# Patient Record
Sex: Male | Born: 1990 | Race: Black or African American | Hispanic: No | Marital: Single | Smoking: Former smoker
Health system: Southern US, Community
[De-identification: ages and names within clinical notes are randomized; demographics above are authoritative.]

---

## 2015-03-14 ENCOUNTER — Emergency Department (HOSPITAL_COMMUNITY): Payer: No Typology Code available for payment source

## 2015-03-14 ENCOUNTER — Emergency Department (HOSPITAL_COMMUNITY)
Admission: EM | Admit: 2015-03-14 | Discharge: 2015-03-14 | Disposition: A | Discharge status: Home / Self Care | Payer: No Typology Code available for payment source | Attending: Emergency Medicine | Admitting: Emergency Medicine

## 2015-03-14 ENCOUNTER — Encounter (HOSPITAL_COMMUNITY): Payer: Self-pay

## 2015-03-14 DIAGNOSIS — Y998 Other external cause status: Secondary | ICD-10-CM | POA: Insufficient documentation

## 2015-03-14 DIAGNOSIS — F17210 Nicotine dependence, cigarettes, uncomplicated: Secondary | ICD-10-CM | POA: Insufficient documentation

## 2015-03-14 DIAGNOSIS — Y9389 Activity, other specified: Secondary | ICD-10-CM | POA: Insufficient documentation

## 2015-03-14 DIAGNOSIS — S60512A Abrasion of left hand, initial encounter: Secondary | ICD-10-CM | POA: Insufficient documentation

## 2015-03-14 DIAGNOSIS — S20212A Contusion of left front wall of thorax, initial encounter: Secondary | ICD-10-CM | POA: Diagnosis not present

## 2015-03-14 DIAGNOSIS — Y92410 Unspecified street and highway as the place of occurrence of the external cause: Secondary | ICD-10-CM | POA: Diagnosis not present

## 2015-03-14 DIAGNOSIS — S3991XA Unspecified injury of abdomen, initial encounter: Secondary | ICD-10-CM | POA: Diagnosis not present

## 2015-03-14 DIAGNOSIS — S299XXA Unspecified injury of thorax, initial encounter: Secondary | ICD-10-CM | POA: Diagnosis present

## 2015-03-14 DIAGNOSIS — S8001XA Contusion of right knee, initial encounter: Secondary | ICD-10-CM | POA: Diagnosis not present

## 2015-03-14 MED ORDER — CYCLOBENZAPRINE HCL 10 MG PO TABS: 10.0000 mg | ORAL_TABLET | Freq: Two times a day (BID) | ORAL | Status: DC | PRN

## 2015-03-14 MED ORDER — IOHEXOL 300 MG/ML  SOLN
75.0000 mL | Freq: Once | INTRAMUSCULAR | Status: AC | PRN
  Administered 2015-03-14: 75 mL via INTRAVENOUS

## 2015-03-14 MED ORDER — NAPROXEN 500 MG PO TABS: 500.0000 mg | ORAL_TABLET | Freq: Two times a day (BID) | ORAL | Status: DC
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
## 2017-09-22 ENCOUNTER — Ambulatory Visit (INDEPENDENT_AMBULATORY_CARE_PROVIDER_SITE_OTHER): Payer: Medicaid Other

## 2017-09-22 ENCOUNTER — Ambulatory Visit (HOSPITAL_COMMUNITY)
Admission: EM | Admit: 2017-09-22 | Discharge: 2017-09-22 | Disposition: A | Discharge status: Home / Self Care | Payer: Medicaid Other | Attending: Family Medicine | Admitting: Family Medicine

## 2017-09-22 ENCOUNTER — Encounter (HOSPITAL_COMMUNITY): Payer: Self-pay | Admitting: Emergency Medicine

## 2017-09-22 ENCOUNTER — Other Ambulatory Visit: Payer: Self-pay

## 2017-09-22 DIAGNOSIS — S92351A Displaced fracture of fifth metatarsal bone, right foot, initial encounter for closed fracture: Secondary | ICD-10-CM

## 2017-09-22 MED ORDER — HYDROCODONE-ACETAMINOPHEN 5-325 MG PO TABS: 1.0000 | ORAL_TABLET | Freq: Four times a day (QID) | ORAL | 0 refills | Status: DC | PRN

## 2017-09-22 MED ORDER — MELOXICAM 7.5 MG PO TABS: 7.5000 mg | ORAL_TABLET | Freq: Every day | ORAL | 0 refills | Status: DC

## 2017-09-22 NOTE — ED Triage Notes (Addendum)
Was playing basketball 2 days ago.  States he landed on foot wrong and has right foot pain Can wiggle toes, pedal pulse 2 +, brisk cap refill

## 2017-09-22 NOTE — ED Provider Notes (Signed)
[facility]    CSN: [id number] Arrival date & time: 09/22/17  1545     History   Chief Complaint Chief Complaint  Patient presents with  . Foot Injury    HPI Albert Valenzuela is a 28 y.o. male.   27 year old male comes in for right foot pain after injury 2 days ago.  States he was playing basketball, jumped up and landed with his foot inverted.  He has not been able to bear weight since, has been hobbling on the left foot, as well as using a cane.  He has swelling to the foot with contusions to the toe.  States some intermittent numbness and tingling of the fourth and fifth toe.  Has tried ice compress, elevation, Ace wrap, ibuprofen with little relief.     History reviewed. No pertinent past medical history.  There are no active problems to display for this patient.   History reviewed. No pertinent surgical history.     Home Medications    Prior to Admission medications   Medication Sig Start Date End Date Taking? Authorizing Provider  cyclobenzaprine (FLEXERIL) 10 MG tablet Take 1 tablet (10 mg total) by mouth 2 (two) times daily as needed for muscle spasms. 03/14/15   Kirichenko, Lemont Fillers, PA-C  HYDROcodone-acetaminophen (NORCO/VICODIN) 5-325 MG tablet Take 1 tablet by mouth every 6 (six) hours as needed. 09/22/17   Cathie Hoops, Yasmina Chico V, PA-C  meloxicam (MOBIC) 7.5 MG tablet Take 1 tablet (7.5 mg total) by mouth daily. 09/22/17   Cathie Hoops, Earsel Shouse V, PA-C  naproxen (NAPROSYN) 500 MG tablet Take 1 tablet (500 mg total) by mouth 2 (two) times daily. 03/14/15   Jaynie Crumble, PA-C    Family History Family History  Problem Relation Age of Onset  . Healthy Mother     Social History Social History   Tobacco Use  . Smoking status: Current Every Day Smoker    Packs/day: 0.50    Types: Cigarettes  Substance Use Topics  . Alcohol use: Yes    Comment: socially   . Drug use: Yes    Types: Marijuana     Allergies   Patient has no known allergies.   Review of  Systems Review of Systems  Reason unable to perform ROS: See HPI as above.     Physical Exam Triage Vital Signs ED Triage Vitals  Enc Vitals Group     BP 09/22/17 1618 126/77     Pulse Rate 09/22/17 1618 89     Resp 09/22/17 1618 20     Temp 09/22/17 1618 98.7 F (37.1 C)     Temp Source 09/22/17 1618 Oral     SpO2 09/22/17 1618 96 %     Weight --      Height --      Head Circumference --      Peak Flow --      Pain Score 09/22/17 1615 7     Pain Loc --      Pain Edu? --      Excl. in GC? --    No data found.  Updated Vital Signs BP 126/77 (BP Location: Right Arm)   Pulse 89   Temp 98.7 F (37.1 C) (Oral)   Resp 20   SpO2 96%   Physical Exam  Constitutional: He is oriented to person, place, and time. He appears well-developed and well-nourished. No distress.  HENT:  Head: Normocephalic and atraumatic.  Eyes: Pupils are equal, round, and reactive to light. Conjunctivae are normal.  Musculoskeletal:  Generalized swelling to the right foot with contusions to the dorsal aspect of lateral foot, 2nd-5th toe.  Tenderness to palpation of distal 3rd-4th MTP.  No tenderness to palpation of ankle.  Full range of motion of ankle.  Strength deferred.  Sensation intact and equal bilaterally.  Pedal pulse 2+ and equal bilaterally.  Cap refill tested on toe pad less than 2 seconds.  Neurological: He is alert and oriented to person, place, and time.  Skin: He is not diaphoretic.     UC Treatments / Results  Labs (all labs ordered are listed, but only abnormal results are displayed) Labs Reviewed - No data to display  EKG None  Radiology Dg Foot Complete Right  Result Date: 09/22/2017 CLINICAL DATA:  Right foot pain after basketball injury 2 days ago. EXAM: RIGHT FOOT COMPLETE - 3+ VIEW COMPARISON:  None. FINDINGS: Moderately displaced fracture is seen involving the proximal base of the fifth metatarsal. No other bony abnormality is noted. Joint spaces are intact. IMPRESSION:  Moderately displaced proximal fifth metatarsal fracture. Electronically Signed   By: Lupita RaiderJames  Green Jr, M.D.   On: 09/22/2017 16:44    Procedures Procedures (including critical care time)  Medications Ordered in UC Medications - No data to display  Initial Impression / Assessment and Plan / UC Course  I have reviewed the triage vital signs and the nursing notes.  Pertinent labs & imaging results that were available during my care of the patient were reviewed by me and considered in my medical decision making (see chart for details).    Discussed x-ray results with patient.  Cam walker, crutches, nonweightbearing until evaluated by orthopedics.  Start Mobic as directed.  Norco for breakthrough pain.  Continue ice compress, elevation.  Follow-up with orthopedics for further evaluation and management needed.  Return precautions given.  Patient expresses understanding and agrees to plan.  Final Clinical Impressions(s) / UC Diagnoses   Final diagnoses:  Closed displaced fracture of fifth metatarsal bone of right foot, initial encounter    ED Prescriptions    Medication Sig Dispense Auth. Provider   meloxicam (MOBIC) 7.5 MG tablet Take 1 tablet (7.5 mg total) by mouth daily. 15 tablet Taleya Whitcher V, PA-C   HYDROcodone-acetaminophen (NORCO/VICODIN) 5-325 MG tablet Take 1 tablet by mouth every 6 (six) hours as needed. 10 tablet Belinda FisherYu, Dekker Verga V, PA-C     Controlled Substance Prescriptions [place] Controlled Substance Registry consulted? Yes, I have consulted the  Controlled Substances Registry for this patient, and feel the risk/benefit ratio today is favorable for proceeding with this prescription for a controlled substance.   Belinda FisherYu, Brighten Orndoff V, PA-C 09/22/17 1712

## 2017-09-22 NOTE — Discharge Instructions (Signed)
Cam walker and crutches. No weight bearing for now. Mobic as directed. Norco for breakthrough pain. Continue ice compress, elevation. Follow up for orthopedics for further evaluation and management needed.

## 2019-05-03 ENCOUNTER — Ambulatory Visit: Payer: Self-pay | Attending: Internal Medicine

## 2019-09-01 IMAGING — DX DG FOOT COMPLETE 3+V*R*
3 series · 3 of 3 positions shown · non-contrast
Comparison: None.

CLINICAL DATA: Right foot pain after basketball injury 2 days ago.

EXAM:
RIGHT FOOT COMPLETE - 3+ VIEW

[foot ap]
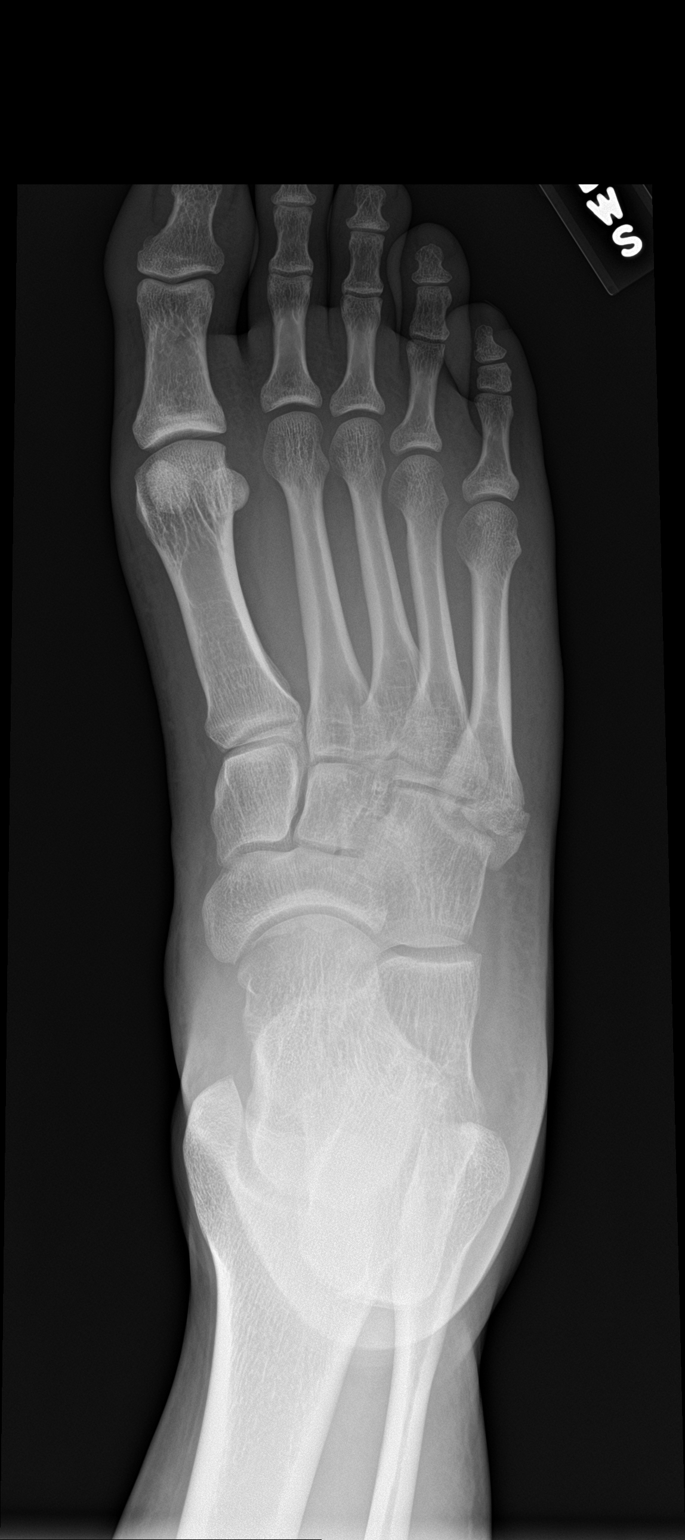

[foot obl]
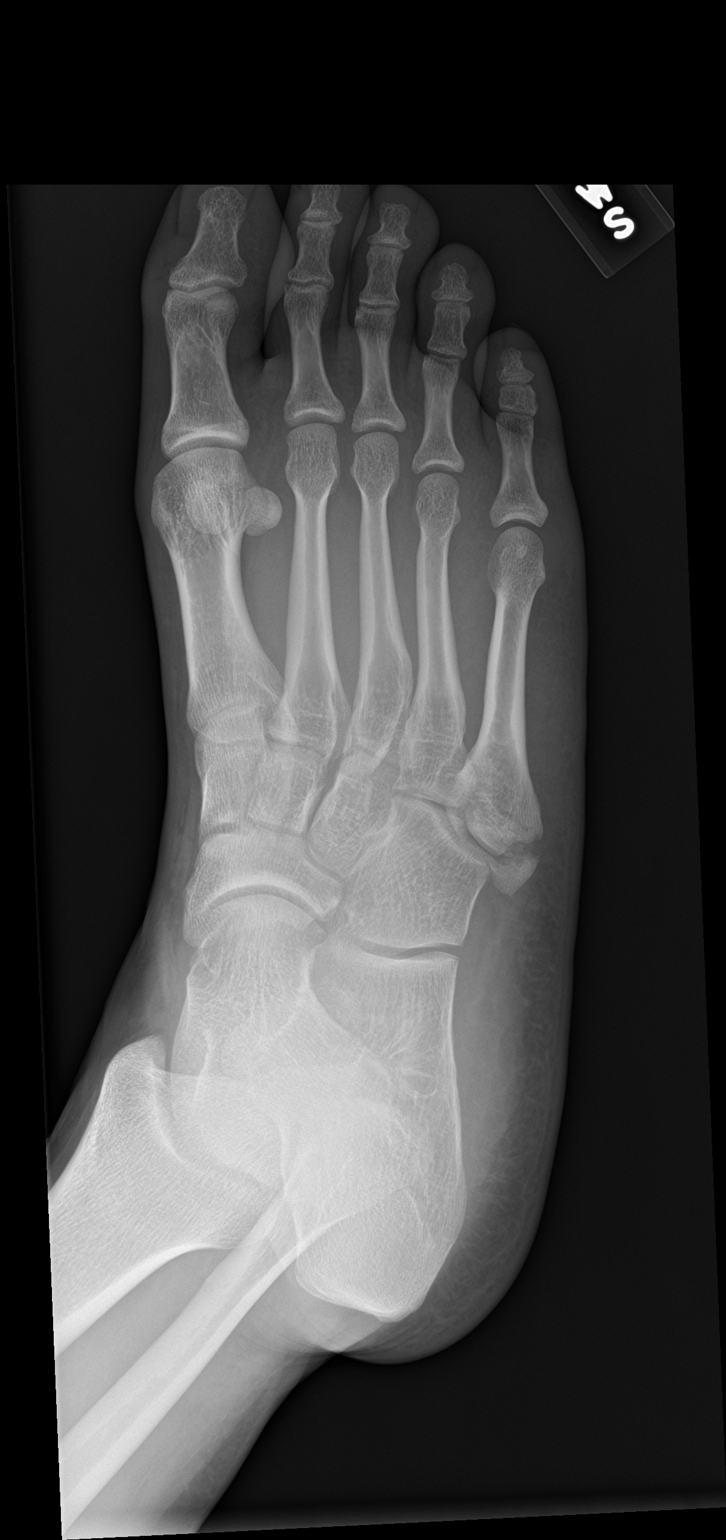

[foot lat]
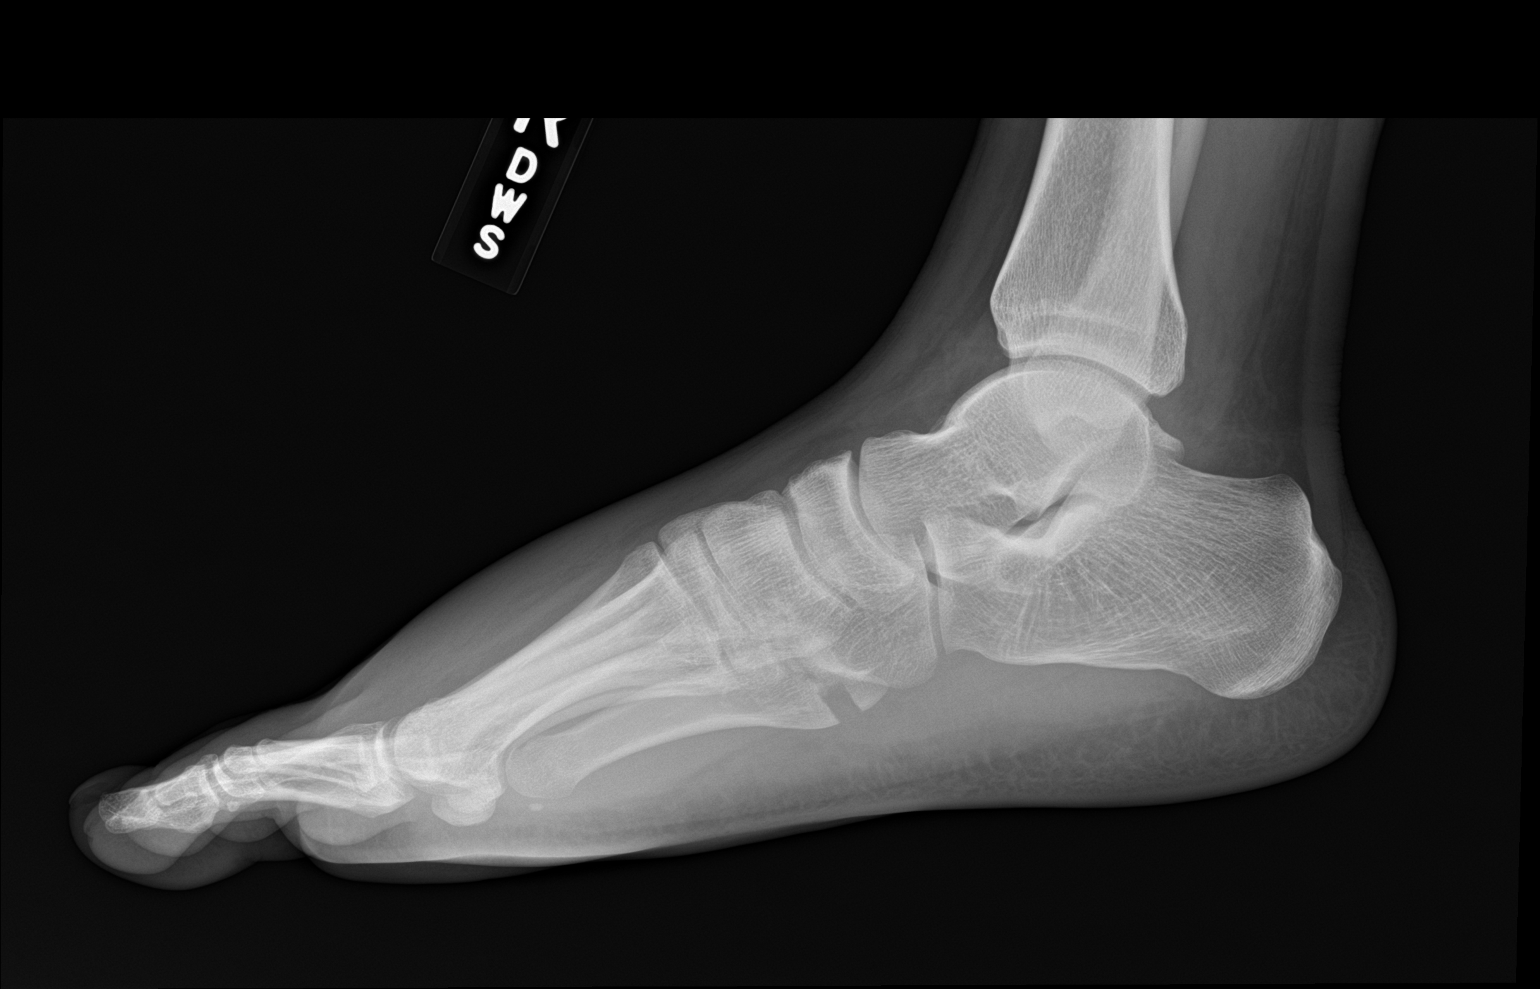

[3 of 3 positions shown; findings below may reference images not displayed]

FINDINGS: Moderately displaced fracture is seen involving the proximal base of
the fifth metatarsal. No other bony abnormality is noted. Joint
spaces are intact.
IMPRESSION: Moderately displaced proximal fifth metatarsal fracture.

## 2020-06-05 ENCOUNTER — Encounter (HOSPITAL_COMMUNITY): Payer: Self-pay | Admitting: Emergency Medicine

## 2021-12-03 ENCOUNTER — Encounter (HOSPITAL_COMMUNITY): Payer: Self-pay

## 2021-12-03 ENCOUNTER — Ambulatory Visit (HOSPITAL_COMMUNITY)
Admission: EM | Admit: 2021-12-03 | Discharge: 2021-12-03 | Disposition: A | Discharge status: Home / Self Care | Payer: Medicaid Other | Attending: Internal Medicine | Admitting: Internal Medicine

## 2021-12-03 DIAGNOSIS — M62830 Muscle spasm of back: Secondary | ICD-10-CM | POA: Insufficient documentation

## 2021-12-03 LAB — COMPREHENSIVE METABOLIC PANEL
ALT: 25 U/L (ref 0–44)
AST: 25 U/L (ref 15–41)
Albumin: 4.2 g/dL (ref 3.5–5.0)
Alkaline Phosphatase: 88 U/L (ref 38–126)
Anion gap: 5 (ref 5–15)
BUN: 19 mg/dL (ref 6–20)
CO2: 26 mmol/L (ref 22–32)
Calcium: 9.1 mg/dL (ref 8.9–10.3)
Chloride: 109 mmol/L (ref 98–111)
Creatinine, Ser: 1.33 mg/dL — ABNORMAL HIGH (ref 0.61–1.24)
GFR, Estimated: >60 mL/min (ref >60)
Glucose, Bld: 114 mg/dL — ABNORMAL HIGH (ref 70–99)
Potassium: 4.3 mmol/L (ref 3.5–5.1)
Sodium: 140 mmol/L (ref 135–145)
Total Bilirubin: 0.6 mg/dL (ref 0.3–1.2)
Total Protein: 6.3 g/dL — ABNORMAL LOW (ref 6.5–8.1)

## 2021-12-03 LAB — POCT URINALYSIS DIPSTICK, ED / UC
Bilirubin Urine: NEGATIVE
Glucose, UA: NEGATIVE mg/dL
Hgb urine dipstick: NEGATIVE
Leukocytes,Ua: NEGATIVE
Nitrite: NEGATIVE
Protein, ur: NEGATIVE mg/dL
Specific Gravity, Urine: 1.02 (ref 1.005–1.030)
Urobilinogen, UA: 1 mg/dL (ref 0.0–1.0)
pH: 6.5 (ref 5.0–8.0)

## 2021-12-03 MED ORDER — IBUPROFEN 800 MG PO TABS: 800.0000 mg | ORAL_TABLET | Freq: Three times a day (TID) | ORAL | 0 refills | Status: DC

## 2021-12-03 MED ORDER — CYCLOBENZAPRINE HCL 5 MG PO TABS: 10.0000 mg | ORAL_TABLET | Freq: Three times a day (TID) | ORAL | 0 refills | Status: DC | PRN

## 2021-12-03 MED ORDER — LORATADINE 10 MG PO TABS: 10.0000 mg | ORAL_TABLET | Freq: Every day | ORAL | 0 refills | Status: AC

## 2021-12-03 NOTE — ED Triage Notes (Signed)
Pt has pain on both left and right side ongoing for a long period of time as per pt. Pt denies fever and nausea. Pt descibe pain as a muscle spasm and gets hot at times in the area

## 2021-12-03 NOTE — Discharge Instructions (Signed)
Your symptoms are most likely muscle spasms to have a low suspicion of more serious organ involvement  However we will obtain lab work today to check your kidneys, you will be notified of any concerning values  You may use ibuprofen and muscle relaxers as needed for comfort, be mindful that muscle relaxers will make you drowsy  You may continue to stretch and massage the area as symptoms occur  You may place ice or heat over the affected area in 10 to 15-minute intervals  You may follow-up with urgent care as needed if symptoms persist or worsen, on your paperwork you have also been given written information to orthopedics for further evaluation and management

## 2021-12-03 NOTE — ED Provider Notes (Signed)
[facility]    CSN: [id number] Arrival date & time: 12/03/21  1456      History   Chief Complaint Chief Complaint  Patient presents with   Weakness    Pt has pain on right side and left side on and off ongoing     HPI Albert Valenzuela is a 31 y.o. male.   Patient presents with intermittent right and left lower back pain occurring for over 1 year.  Pain does not radiate and is described as a spasming.  Endorses site becomes hot almost like a flash and then resolves.  Stretching improves symptoms.  Symptoms occur without precipitating event.  Has attempted use of ibuprofen which has been helpful.  Denies numbness, tingling, injury or trauma.,  Abdominal pain, nausea, vomiting or diarrhea, urinary symptoms.  History reviewed. No pertinent past medical history.  There are no problems to display for this patient.   History reviewed. No pertinent surgical history.     Home Medications    Prior to Admission medications   Medication Sig Start Date End Date Taking? Authorizing Provider  cyclobenzaprine (FLEXERIL) 10 MG tablet Take 1 tablet (10 mg total) by mouth 2 (two) times daily as needed for muscle spasms. 03/14/15   Kirichenko, Lemont Fillers, PA-C  HYDROcodone-acetaminophen (NORCO/VICODIN) 5-325 MG tablet Take 1 tablet by mouth every 6 (six) hours as needed. 09/22/17   Cathie Hoops, Amy V, PA-C  meloxicam (MOBIC) 7.5 MG tablet Take 1 tablet (7.5 mg total) by mouth daily. 09/22/17   Cathie Hoops, Amy V, PA-C  naproxen (NAPROSYN) 500 MG tablet Take 1 tablet (500 mg total) by mouth 2 (two) times daily. 03/14/15   Jaynie Crumble, PA-C    Family History Family History  Problem Relation Age of Onset   Healthy Mother     Social History Social History   Tobacco Use   Smoking status: Every Day    Packs/day: 0.50    Types: Cigarettes  Substance Use Topics   Alcohol use: Yes    Comment: socially    Drug use: Yes    Types: Marijuana     Allergies   Patient has no known  allergies.   Review of Systems Review of Systems  Constitutional: Negative.   Respiratory: Negative.    Cardiovascular: Negative.   Gastrointestinal: Negative.   Genitourinary: Negative.   Musculoskeletal:  Positive for back pain. Negative for arthralgias, gait problem, joint swelling, myalgias, neck pain and neck stiffness.  Skin: Negative.   Neurological: Negative.      Physical Exam Triage Vital Signs ED Triage Vitals  Enc Vitals Group     BP 12/03/21 1522 114/77     Pulse Rate 12/03/21 1522 95     Resp 12/03/21 1522 12     Temp 12/03/21 1522 98.3 F (36.8 C)     Temp Source 12/03/21 1522 Oral     SpO2 12/03/21 1522 96 %     Weight 12/03/21 1525 215 lb (97.5 kg)     Height 12/03/21 1525 5\' 6"  (1.676 m)     Head Circumference --      Peak Flow --      Pain Score 12/03/21 1525 6     Pain Loc --      Pain Edu? --      Excl. in GC? --    No data found.  Updated Vital Signs BP 114/77 (BP Location: Left Arm)   Pulse 95   Temp 98.3 F (36.8 C) (Oral)   Resp 12  Ht 5\' 6"  (1.676 m)   Wt 215 lb (97.5 kg)   SpO2 96%   BMI 34.70 kg/m   Visual Acuity Right Eye Distance:   Left Eye Distance:   Bilateral Distance:    Right Eye Near:   Left Eye Near:    Bilateral Near:     Physical Exam Constitutional:      Appearance: Normal appearance.  Eyes:     Extraocular Movements: Extraocular movements intact.  Pulmonary:     Effort: Pulmonary effort is normal.  Abdominal:     Tenderness: There is no right CVA tenderness or left CVA tenderness.  Musculoskeletal:     Comments: Unable to reproduce tenderness on exam however patient begins to have a spasm while on exam table, no ecchymosis, swelling or deformity, negative straight leg test, able to bear weight to the lower extremities, range of motion intact  Neurological:     Mental Status: He is alert and oriented to person, place, and time. Mental status is at baseline.      UC Treatments / Results  Labs (all  labs ordered are listed, but only abnormal results are displayed) Labs Reviewed  POCT URINALYSIS DIPSTICK, ED / UC    EKG   Radiology No results found.  Procedures Procedures (including critical care time)  Medications Ordered in UC Medications - No data to display  Initial Impression / Assessment and Plan / UC Course  I have reviewed the triage vital signs and the nursing notes.  Pertinent labs & imaging results that were available during my care of the patient were reviewed by me and considered in my medical decision making (see chart for details).  Back muscle spasm  Etiology is most likely muscular based on timeline and presentation, urinalysis negative, CMP pending to rule out kidney involvement, negative for CVA tenderness, discussed with patient, prescribed Profen and Flexeril for outpatient management and recommended RICE, and stretching and massage for additional supportive measures, follow-up with urgent care as needed symptoms persist or worsen Final Clinical Impressions(s) / UC Diagnoses   Final diagnoses:  None   Discharge Instructions   None    ED Prescriptions   None    PDMP not reviewed this encounter.   , NP 12/03/21 1709

## 2022-07-28 ENCOUNTER — Other Ambulatory Visit: Payer: Self-pay

## 2022-07-28 ENCOUNTER — Emergency Department (HOSPITAL_COMMUNITY): Payer: Medicaid Other

## 2022-07-28 ENCOUNTER — Emergency Department (HOSPITAL_COMMUNITY)
Admission: EM | Admit: 2022-07-28 | Discharge: 2022-07-28 | Disposition: A | Discharge status: Home / Self Care | Payer: Medicaid Other | Attending: Emergency Medicine | Admitting: Emergency Medicine

## 2022-07-28 DIAGNOSIS — S1191XA Laceration without foreign body of unspecified part of neck, initial encounter: Secondary | ICD-10-CM | POA: Insufficient documentation

## 2022-07-28 DIAGNOSIS — W3409XA Accidental discharge from other specified firearms, initial encounter: Secondary | ICD-10-CM | POA: Insufficient documentation

## 2022-07-28 DIAGNOSIS — W3400XA Accidental discharge from unspecified firearms or gun, initial encounter: Secondary | ICD-10-CM

## 2022-07-28 DIAGNOSIS — S1190XA Unspecified open wound of unspecified part of neck, initial encounter: Secondary | ICD-10-CM | POA: Diagnosis present

## 2022-07-28 DIAGNOSIS — Z23 Encounter for immunization: Secondary | ICD-10-CM | POA: Diagnosis not present

## 2022-07-28 DIAGNOSIS — Y249XXA Unspecified firearm discharge, undetermined intent, initial encounter: Secondary | ICD-10-CM

## 2022-07-28 LAB — I-STAT CHEM 8, ED
BUN: 13 mg/dL (ref 6–20)
Calcium, Ion: 1.04 mmol/L — ABNORMAL LOW (ref 1.15–1.40)
Chloride: 105 mmol/L (ref 98–111)
Creatinine, Ser: 1.6 mg/dL — ABNORMAL HIGH (ref 0.61–1.24)
Glucose, Bld: 106 mg/dL — ABNORMAL HIGH (ref 70–99)
HCT: 48 % (ref 39.0–52.0)
Hemoglobin: 16.3 g/dL (ref 13.0–17.0)
Potassium: 3.5 mmol/L (ref 3.5–5.1)
Sodium: 143 mmol/L (ref 135–145)
TCO2: 23 mmol/L (ref 22–32)

## 2022-07-28 LAB — PROTIME-INR
INR: 0.9 (ref 0.8–1.2)
Prothrombin Time: 11.5 seconds (ref 11.4–15.2)

## 2022-07-28 LAB — CBC
HCT: 45.9 % (ref 39.0–52.0)
Hemoglobin: 15.9 g/dL (ref 13.0–17.0)
MCH: 35.4 pg — ABNORMAL HIGH (ref 26.0–34.0)
MCHC: 34.6 g/dL (ref 30.0–36.0)
MCV: 102.2 fL — ABNORMAL HIGH (ref 80.0–100.0)
Platelets: 301 10*3/uL (ref 150–400)
RBC: 4.49 MIL/uL (ref 4.22–5.81)
RDW: 12.7 % (ref 11.5–15.5)
WBC: 10.2 10*3/uL (ref 4.0–10.5)
nRBC: 0 % (ref 0.0–0.2)

## 2022-07-28 LAB — COMPREHENSIVE METABOLIC PANEL
ALT: 107 U/L — ABNORMAL HIGH (ref 0–44)
AST: 74 U/L — ABNORMAL HIGH (ref 15–41)
Albumin: 4.5 g/dL (ref 3.5–5.0)
Alkaline Phosphatase: 94 U/L (ref 38–126)
Anion gap: 18 — ABNORMAL HIGH (ref 5–15)
BUN: 12 mg/dL (ref 6–20)
CO2: 20 mmol/L — ABNORMAL LOW (ref 22–32)
Calcium: 9 mg/dL (ref 8.9–10.3)
Chloride: 104 mmol/L (ref 98–111)
Creatinine, Ser: 1.27 mg/dL — ABNORMAL HIGH (ref 0.61–1.24)
GFR, Estimated: >60 mL/min (ref >60)
Glucose, Bld: 109 mg/dL — ABNORMAL HIGH (ref 70–99)
Potassium: 3.5 mmol/L (ref 3.5–5.1)
Sodium: 142 mmol/L (ref 135–145)
Total Bilirubin: 0.5 mg/dL (ref 0.3–1.2)
Total Protein: 7.4 g/dL (ref 6.5–8.1)

## 2022-07-28 LAB — SAMPLE TO BLOOD BANK

## 2022-07-28 LAB — LACTIC ACID, PLASMA: Lactic Acid, Venous: 3.2 mmol/L (ref 0.5–1.9)

## 2022-07-28 LAB — ETHANOL: Alcohol, Ethyl (B): 302 mg/dL (ref <10)

## 2022-07-28 MED ORDER — IOHEXOL 350 MG/ML SOLN
75.0000 mL | Freq: Once | INTRAVENOUS | Status: AC | PRN
  Administered 2022-07-28: 75 mL via INTRAVENOUS

## 2022-07-28 MED ORDER — TETANUS-DIPHTH-ACELL PERTUSSIS 5-2.5-18.5 LF-MCG/0.5 IM SUSY
0.5000 mL | PREFILLED_SYRINGE | Freq: Once | INTRAMUSCULAR | Status: AC
  Administered 2022-07-28: 0.5 mL via INTRAMUSCULAR
  Filled 2022-07-28: qty 0.5

## 2022-07-28 MED ORDER — LACTATED RINGERS IV BOLUS: 1000.0000 mL | Freq: Once | INTRAVENOUS | Status: DC
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
## 2022-07-28 NOTE — Progress Notes (Signed)
Orthopedic Tech Progress Note Patient Details:  Albert Valenzuela Oct 05, 1990 [id number]  Patient ID: Albert Valenzuela, male   DOB: [birth year], 32 y.o.   MRN: [id number] I attended trauma page. Albert Valenzuela 07/28/2022, 4:50 AM
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
## 2022-07-28 NOTE — ED Notes (Signed)
Patient transported to CT 

## 2022-07-28 NOTE — ED Notes (Signed)
Pt IV out and d/c paper handed to patient. Patient refused IV. fluids

## 2022-07-29 ENCOUNTER — Ambulatory Visit
Admission: EM | Admit: 2022-07-29 | Discharge: 2022-07-29 | Disposition: A | Discharge status: Home / Self Care | Payer: Medicaid Other | Attending: Urgent Care | Admitting: Urgent Care

## 2022-07-29 DIAGNOSIS — S1193XA Puncture wound without foreign body of unspecified part of neck, initial encounter: Secondary | ICD-10-CM | POA: Diagnosis not present

## 2022-07-29 DIAGNOSIS — T148XXA Other injury of unspecified body region, initial encounter: Secondary | ICD-10-CM

## 2022-07-29 DIAGNOSIS — L089 Local infection of the skin and subcutaneous tissue, unspecified: Secondary | ICD-10-CM

## 2022-07-29 MED ORDER — TIZANIDINE HCL 4 MG PO TABS: 4.0000 mg | ORAL_TABLET | Freq: Every day | ORAL | 0 refills | Status: AC

## 2022-07-29 MED ORDER — NAPROXEN 500 MG PO TABS: 500.0000 mg | ORAL_TABLET | Freq: Two times a day (BID) | ORAL | 0 refills | Status: AC

## 2022-07-29 MED ORDER — NAPROXEN 500 MG PO TABS: 500.0000 mg | ORAL_TABLET | Freq: Two times a day (BID) | ORAL | 0 refills | Status: DC

## 2022-07-29 MED ORDER — CEPHALEXIN 500 MG PO CAPS: 500.0000 mg | ORAL_CAPSULE | Freq: Three times a day (TID) | ORAL | 0 refills | Status: AC
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
## 2022-07-29 NOTE — Discharge Instructions (Signed)
Please start cephalexin for the wound infection. Change your dressing 3-5 times daily. Every time you change your dressing, clean the wound gently with warm water and Dial antibacterial soap. Pat the wound dry, let it breathe for roughly an hour before covering it back up. When you reapply a dressing, apply Bacitracin ointment to the wound, then cover with non-stick/non-adherent gauze.
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
## 2022-08-05 ENCOUNTER — Encounter: Payer: Medicaid Other | Attending: Physician Assistant | Admitting: Physician Assistant

## 2022-08-05 DIAGNOSIS — S1193XA Puncture wound without foreign body of unspecified part of neck, initial encounter: Secondary | ICD-10-CM | POA: Insufficient documentation

## 2022-08-05 DIAGNOSIS — L98492 Non-pressure chronic ulcer of skin of other sites with fat layer exposed: Secondary | ICD-10-CM | POA: Insufficient documentation

## 2022-08-05 DIAGNOSIS — W3400XA Accidental discharge from unspecified firearms or gun, initial encounter: Secondary | ICD-10-CM | POA: Insufficient documentation

## 2022-08-05 NOTE — Progress Notes (Signed)
RAMIE, ABDALLA ([id number]) [phone] [id number] Page 1 of 4 Visit Report for 08/05/2022 Abuse Risk Screen Details Patient Name: Date of Service: Albert Valenzuela, Albert Valenzuela ND 08/05/2022 2:00 PM Medical Record Number: [id number] Patient Account Number: [id number] Date of Birth/Sex: Treating RN: Aug 14, 1990 (31 y.o. Roel Cluck Primary Care Kamillah Didonato: PA Zenovia Jordan, NO Other Clinician: Referring Tangela Dolliver: Treating Rasheida Broden/Extender: Christella Noa in Treatment: 0 Abuse Risk Screen Items Answer Electronic Signature(s) Signed: 08/05/2022 3:04:30 PM By: Midge Aver MSN RN CNS WTA Entered By: Midge Aver on 08/05/2022 13:52:58 -------------------------------------------------------------------------------- Activities of Daily Living Details Patient Name: Date of Service: Albert Valenzuela, Albert Valenzuela ND 08/05/2022 2:00 PM Medical Record Number: [id number] Patient Account Number: [id number] Date of Birth/Sex: Treating RN: Oct 23, 1990 (31 y.o. Roel Cluck Primary Care Yeni Jiggetts: PA Zenovia Jordan, NO Other Clinician: Referring Jamisha Hoeschen: Treating Madyn Ivins/Extender: Christella Noa in Treatment: 0 Activities of Daily Living Items Answer Activities of Daily Living (Please select one for each item) Drive Automobile Completely Able T Medications ake Completely Able Use T elephone Completely Able Care for Appearance Completely Able Use T oilet Completely Able Bath / Shower Completely Able Dress Self Completely Able Feed Self Completely Able Walk Completely Able Get In / Out Bed Completely Able Housework Completely Able Prepare Meals Completely Able Handle Money Completely Able Shop for Self Completely Able Electronic Signature(s) Signed: 08/05/2022 3:04:30 PM By: Midge Aver MSN RN CNS WTA Entered By: Midge Aver on 08/05/2022 13:53:10 -------------------------------------------------------------------------------- Education Screening Details Patient Name: Date of  Service: Albert Valenzuela ND 08/05/2022 2:00 PM Medical Record Number: [id number] Patient Account Number: [id number] Date of Birth/Sex: Treating RN: 16-Feb-1991 (31 y.o. Roel Cluck Primary Care Adelita Hone: PA Zenovia Jordan, NO Other Clinician: Referring Jermal Dismuke: Treating Donyell Carrell/Extender: Christella Noa in Treatment: 0 Learning Preferences/Education Level/Primary Language Learning Preference: Explanation, Demonstration Melodie Bouillon ([id number]) [id number] Page 2 of 4 Highest Education Level: High School Preferred Language: Economist Language Barrier: No Translator Needed: No Memory Deficit: No Emotional Barrier: No Cultural/Religious Beliefs Affecting Medical Care: No Physical Barrier Impaired Vision: No Impaired Hearing: No Decreased Hand dexterity: No Knowledge/Comprehension Knowledge Level: High Comprehension Level: High Ability to understand written instructions: High Ability to understand verbal instructions: High Motivation Anxiety Level: Calm Cooperation: Cooperative Education Importance: Acknowledges Need Interest in Health Problems: Asks Questions Perception: Coherent Willingness to Engage in Self-Management High Activities: Readiness to Engage in Self-Management High Activities: Electronic Signature(s) Signed: 08/05/2022 3:04:30 PM By: Midge Aver MSN RN CNS WTA Entered By: Midge Aver on 08/05/2022 13:53:45 -------------------------------------------------------------------------------- Fall Risk Assessment Details Patient Name: Date of Service: Albert Valenzuela ND 08/05/2022 2:00 PM Medical Record Number: [id number] Patient Account Number: [id number] Date of Birth/Sex: Treating RN: July 24, 1990 (31 y.o. Roel Cluck Primary Care Seaton Hofmann: PA Zenovia Jordan, NO Other Clinician: Referring Kayron Hicklin: Treating Janeya Deyo/Extender: Christella Noa in Treatment: 0 Fall Risk Assessment Items Have you  had 2 or more falls in the last 12 monthso 0 No Have you had any fall that resulted in injury in the last 12 monthso 0 No FALLS RISK SCREEN History of falling - immediate or within 3 months 0 No Secondary diagnosis (Do you have 2 or more medical diagnoseso) 0 No Ambulatory aid None/bed rest/wheelchair/nurse 0 No Crutches/cane/walker 0 No Furniture 0 No Intravenous therapy Access/Saline/Heparin Lock 0 No Gait/Transferring Normal/ bed rest/ wheelchair 0 No Weak (short steps with or without shuffle, stooped but able to lift head while walking, may seek 0 No support from furniture) Impaired (short steps with shuffle, may have  difficulty arising from chair, head down, impaired 0 No balance) Mental Status Oriented to own ability 0 Yes Electronic Signature(s) Signed: 08/05/2022 3:04:30 PM By: Midge Aver MSN RN CNS WTA Entered By: Midge Aver on 08/05/2022 13:53:56 Melodie Bouillon ([id number]) [id number] Page 3 of 4 -------------------------------------------------------------------------------- Foot Assessment Details Patient Name: Date of Service: Albert Valenzuela, Albert Valenzuela ND 08/05/2022 2:00 PM Medical Record Number: [id number] Patient Account Number: [id number] Date of Birth/Sex: Treating RN: 12-21-1990 (31 y.o. Roel Cluck Primary Care Lean Jaeger: PA TIENT, NO Other Clinician: Referring Teighlor Korson: Treating Clema Skousen/Extender: Christella Noa in Treatment: 0 Foot Assessment Items Site Locations + = Sensation present, - = Sensation absent, C = Callus, U = Ulcer R = Redness, W = Warmth, M = Maceration, PU = Pre-ulcerative lesion F = Fissure, S = Swelling, D = Dryness Assessment Right: Left: Other Deformity: No No Prior Foot Ulcer: No No Prior Amputation: No No Charcot Joint: No No Ambulatory Status: Ambulatory Without Help Gait: Steady Electronic Signature(s) Signed: 08/05/2022 3:04:30 PM By: Midge Aver MSN RN CNS WTA Entered By: Midge Aver on 08/05/2022 13:54:24 -------------------------------------------------------------------------------- Nutrition Risk Screening Details Patient Name: Date of Service: Albert Valenzuela, Albert Valenzuela ND 08/05/2022 2:00 PM Medical Record Number: [id number] Patient Account Number: [id number] Date of Birth/Sex: Treating RN: [birth year] (31 y.o. Roel Cluck Primary Care Katelyn Broadnax: PA Zenovia Jordan, NO Other Clinician: Referring Jennifer Payes: Treating Guiseppe Flanagan/Extender: Christella Noa in Treatment: 0 Height (in): 66 Weight (lbs): 217 Body Mass Index (BMI): 35 Nutrition Risk Screening Items Score Screening CAL, GINDLESPERGER ([id number]) [id number] Page 4 of 4 NUTRITION RISK SCREEN: I have an illness or condition that made me change the kind and/or amount of food I eat 0 No I eat fewer than two meals per day 0 No I eat few fruits and vegetables, or milk products 0 No I have three or more drinks of beer, liquor or wine almost every day 0 No I have tooth or mouth problems that make it hard for me to eat 0 No I don't always have enough money to buy the food I need 0 No I eat alone most of the time 0 No I take three or more different prescribed or over-the-counter drugs a day 1 Yes Without wanting to, I have lost or gained 10 pounds in the last six months 0 No I am not always physically able to shop, cook and/or feed myself 0 No Nutrition Protocols Good Risk Protocol 0 No interventions needed Moderate Risk Protocol High Risk Proctocol Risk Level: Good Risk Score: 1 Electronic Signature(s) Signed: 08/05/2022 3:04:30 PM By: Midge Aver MSN RN CNS WTA Entered By: Midge Aver on 08/05/2022 13:54:15

## 2022-08-05 NOTE — Progress Notes (Signed)
HABEEB, PUERTAS ([id number]) [id number] Page 1 of 8 Visit Report for 08/05/2022 Allergy List Details Patient Name: Date of Service: Albert Valenzuela, Albert Valenzuela ND 08/05/2022 2:00 PM Medical Record Number: [id number] Patient Account Number: [id number] Date of Birth/Sex: Treating RN: [birth year] (31 y.o. Roel Cluck Primary Care Cheria Sadiq: PA Zenovia Jordan, NO Other Clinician: Referring Larenzo Caples: Treating Sayre Witherington/Extender: Christella Noa in Treatment: 0 Allergies Active Allergies No Known Allergies Allergy Notes Electronic Signature(s) Signed: 08/05/2022 3:04:30 PM By: Midge Aver MSN RN CNS WTA Entered By: Midge Aver on 08/05/2022 13:49:59 -------------------------------------------------------------------------------- Arrival Information Details Patient Name: Date of Service: Albert Valenzuela ND 08/05/2022 2:00 PM Medical Record Number: [id number] Patient Account Number: [id number] Date of Birth/Sex: Treating RN: 02-19-1991 (31 y.o. Roel Cluck Primary Care Clary Meeker: PA Zenovia Jordan, NO Other Clinician: Referring Jackelynn Hosie: Treating Alexine Pilant/Extender: Christella Noa in Treatment: 0 Visit Information Patient Arrived: Ambulatory Arrival Time: 13:46 Accompanied By: best friend Transfer Assistance: None Patient Identification Verified: Yes Secondary Verification Process Completed: Yes Patient Requires Transmission-Based Precautions: No Patient Has Alerts: Yes Patient Alerts: NOT diabetic Electronic Signature(s) Signed: 08/05/2022 3:04:30 PM By: Midge Aver MSN RN CNS WTA Entered By: Midge Aver on 08/05/2022 13:46:39 -------------------------------------------------------------------------------- Clinic Level of Care Assessment Details Patient Name: Date of Service: Albert Valenzuela, Albert Valenzuela ND 08/05/2022 2:00 PM Medical Record Number: [id number] Patient Account Number: [id number] Date of Birth/Sex: Treating RN: September 22, 1990 (31 y.o. Roel Cluck Primary  Care Fran Neiswonger: PA Zenovia Jordan, NO Other Clinician: Referring Catharine Kettlewell: Treating Ethelyn Cerniglia/Extender: Christella Noa in Treatment: 0 Clinic Level of Care Assessment Items TOOL 2 Quantity Score X- 1 0 Use when only an EandM is performed on the INITIAL visit ASSESSMENTS - Nursing Assessment / Reassessment X- 1 20 General Physical Exam (combine w/ comprehensive assessment (listed just below) when performed on new pt. [address]RAIMUNDO, CORBIT ([id number]) [id number] Page 2 of 8 X- 1 25 Comprehensive Assessment (HX, ROS, Risk Assessments, Wounds Hx, etc.) ASSESSMENTS - Wound and Skin A ssessment / Reassessment X - Simple Wound Assessment / Reassessment - one wound 1 5 []  - 0 Complex Wound Assessment / Reassessment - multiple wounds []  - 0 Dermatologic / Skin Assessment (not related to wound area) ASSESSMENTS - Ostomy and/or Continence Assessment and Care []  - 0 Incontinence Assessment and Management []  - 0 Ostomy Care Assessment and Management (repouching, etc.) PROCESS - Coordination of Care X - Simple Patient / Family Education for ongoing care 1 15 []  - 0 Complex (extensive) Patient / Family Education for ongoing care X- 1 10 Staff obtains Chiropractor, Records, T Results / Process Orders est []  - 0 Staff telephones HHA, Nursing Homes / Clarify orders / etc []  - 0 Routine Transfer to another Facility (non-emergent condition) []  - 0 Routine Hospital Admission (non-emergent condition) X- 1 15 New Admissions / Manufacturing engineer / Ordering NPWT Apligraf, etc. , []  - 0 Emergency Hospital Admission (emergent condition) X- 1 10 Simple Discharge Coordination []  - 0 Complex (extensive) Discharge Coordination PROCESS - Special Needs []  - 0 Pediatric / Minor Patient Management []  - 0 Isolation Patient Management []  - 0 Hearing / Language / Visual special needs []  - 0 Assessment of Community assistance (transportation, D/C planning, etc.) []   - 0 Additional assistance / Altered mentation []  - 0 Support Surface(s) Assessment (bed, cushion, seat, etc.) INTERVENTIONS - Wound Cleansing / Measurement X- 1 5 Wound Imaging (photographs - any number of wounds) []  - 0 Wound Tracing (instead of photographs) X- 1 5 Simple Wound Measurement - one wound []  -  0 Complex Wound Measurement - multiple wounds X- 1 5 Simple Wound Cleansing - one wound  - 0 Complex Wound Cleansing - multiple wounds INTERVENTIONS - Wound Dressings X - Small Wound Dressing one or multiple wounds 1 10  - 0 Medium Wound Dressing one or multiple wounds  - 0 Large Wound Dressing one or multiple wounds  - 0 Application of Medications - injection INTERVENTIONS - Miscellaneous  - 0 External ear exam  - 0 Specimen Collection (cultures, biopsies, blood, body fluids, etc.)  - 0 Specimen(s) / Culture(s) sent or taken to Lab for analysis  - 0 Patient Transfer (multiple staff / Michiel Sites Lift / Similar devices)  - 0 Simple Staple / Suture removal (25 or less)  - 0 Complex Staple / Suture removal (26 or more) Albert Valenzuela, Albert Valenzuela ([id number]) [id number] Page 3 of 8  - 0 Hypo / Hyperglycemic Management (close monitor of Blood Glucose)  - 0 Ankle / Brachial Index (ABI) - do not check if billed separately Has the patient been seen at the hospital within the last three years: Yes Total Score: 125 Level Of Care: New/Established - Level 4 Electronic Signature(s) Signed: 08/05/2022 3:04:30 PM By: Midge Aver MSN RN CNS WTA Entered By: Midge Aver on 08/05/2022 14:29:59 -------------------------------------------------------------------------------- Encounter Discharge Information Details Patient Name: Date of Service: Albert Valenzuela ND 08/05/2022 2:00 PM Medical Record Number: [id number] Patient Account Number: [id number] Date of Birth/Sex: Treating RN: [birth year] (31 y.o. Roel Cluck Primary Care Anselma Herbel: PA Zenovia Jordan,  NO Other Clinician: Referring Dyrell Tuccillo: Treating Kalai Baca/Extender: Christella Noa in Treatment: 0 Encounter Discharge Information Items Discharge Condition: Stable Ambulatory Status: Ambulatory Discharge Destination: Home Transportation: Private Auto Accompanied By: friend Schedule Follow-up Appointment: Yes Clinical Summary of Care: Electronic Signature(s) Signed: 08/05/2022 3:04:30 PM By: Midge Aver MSN RN CNS WTA Entered By: Midge Aver on 08/05/2022 14:33:44 -------------------------------------------------------------------------------- Lower Extremity Assessment Details Patient Name: Date of Service: Albert Valenzuela, Albert Valenzuela ND 08/05/2022 2:00 PM Medical Record Number: [id number] Patient Account Number: [id number] Date of Birth/Sex: Treating RN: Aug 28, 1990 (31 y.o. Roel Cluck Primary Care Essex Perry: PA Zenovia Jordan, NO Other Clinician: Referring Brieonna Crutcher: Treating Voncille Simm/Extender: Christella Noa in Treatment: 0 Electronic Signature(s) Signed: 08/05/2022 3:04:30 PM By: Midge Aver MSN RN CNS WTA Entered By: Midge Aver on 08/05/2022 14:01:57 -------------------------------------------------------------------------------- Multi Wound Chart Details Patient Name: Date of Service: Albert Valenzuela ND 08/05/2022 2:00 PM Medical Record Number: [id number] Patient Account Number: [id number] Date of Birth/Sex: Treating RN: Jun 08, 1990 (31 y.o. Roel Cluck Primary Care Cornell Gaber: PA Zenovia Jordan, NO Other Clinician: Referring Klark Vanderhoef: Treating Rasheen Bells/Extender: Christella Noa in Treatment: 0 Vital Signs Height(in): 66 Pulse(bpm): 81 Weight(lbs): 217 Blood Pressure(mmHg): 133/87 Body Mass Index(BMI): 35 Temperature(F): 99.0 Respiratory Rate(breaths/Albert Valenzuela): 16 [Albert Valenzuela, Albert Valenzuela ([id number]):Photos:] [[id number].pdf Page 4 of 8:1 N/A N/A N/A N/A] Right Neck N/A N/A Wound Location: Trauma N/A N/A Wounding Event: Trauma,  Other N/A N/A Primary Etiology: 07/28/2022 N/A N/A Date Acquired: 0 N/A N/A Weeks of Treatment: Open N/A N/A Wound Status: No N/A N/A Wound Recurrence: 2x1.7x0.1 N/A N/A Measurements L x W x D (cm) 2.67 N/A N/A A (cm) : rea 0.267 N/A N/A Volume (cm) : Full Thickness Without Exposed N/A N/A Classification: Support Structures Medium N/A N/A Exudate Amount: Serosanguineous N/A N/A Exudate Type: red, brown N/A N/A Exudate Color: Small (1-33%) N/A N/A Granulation Amount: Pink N/A N/A Granulation Quality: Medium (34-66%) N/A N/A Necrotic Amount: Fat Layer (Subcutaneous Tissue): Yes N/A N/A Exposed Structures: Fascia: No Tendon: No Muscle: No Joint:  No Bone: No None N/A N/A Epithelialization: Treatment Notes Electronic Signature(s) Signed: 08/05/2022 3:04:30 PM By: Midge Aver MSN RN CNS WTA Entered By: Midge Aver on 08/05/2022 14:18:29 -------------------------------------------------------------------------------- Multi-Disciplinary Care Plan Details Patient Name: Date of Service: Albert Valenzuela ND 08/05/2022 2:00 PM Medical Record Number: [id number] Patient Account Number: [id number] Date of Birth/Sex: Treating RN: July 02, 1990 (31 y.o. Roel Cluck Primary Care Briggs Edelen: PA Zenovia Jordan, NO Other Clinician: Referring Bonita Brindisi: Treating Aydien Majette/Extender: Christella Noa in Treatment: 0 Active Inactive Necrotic Tissue Nursing Diagnoses: Impaired tissue integrity related to necrotic/devitalized tissue Knowledge deficit related to management of necrotic/devitalized tissue Goals: Necrotic/devitalized tissue will be minimized in the wound bed Date Initiated: 08/05/2022 Target Resolution Date: 09/04/2022 Goal Status: Active Patient/caregiver will verbalize understanding of reason and process for debridement of necrotic tissue Date Initiated: 08/05/2022 Target Resolution Date: 09/04/2022 Goal Status: Active Interventions: Assess patient pain level pre-,  during and post procedure and prior to discharge Provide education on necrotic tissue and debridement process Albert Valenzuela, Albert Valenzuela ([id number]) [id number] Page 5 of 8 Treatment Activities: Apply topical anesthetic as ordered : 08/05/2022 Notes: Orientation to the Wound Care Program Nursing Diagnoses: Knowledge deficit related to the wound healing center program Goals: Patient/caregiver will verbalize understanding of the Wound Healing Center Program Date Initiated: 08/05/2022 Target Resolution Date: 09/04/2022 Goal Status: Active Interventions: Provide education on orientation to the wound center Notes: Wound/Skin Impairment Nursing Diagnoses: Impaired tissue integrity Knowledge deficit related to smoking impact on wound healing Knowledge deficit related to ulceration/compromised skin integrity Goals: Patient will demonstrate a reduced rate of smoking or cessation of smoking Date Initiated: 08/05/2022 Target Resolution Date: 09/04/2022 Goal Status: Active Patient/caregiver will verbalize understanding of skin care regimen Date Initiated: 08/05/2022 Target Resolution Date: 09/04/2022 Goal Status: Active Ulcer/skin breakdown will have a volume reduction of 30% by week 4 Date Initiated: 08/05/2022 Target Resolution Date: 09/04/2022 Goal Status: Active Ulcer/skin breakdown will have a volume reduction of 50% by week 8 Date Initiated: 08/05/2022 Target Resolution Date: 10/05/2022 Goal Status: Active Ulcer/skin breakdown will have a volume reduction of 80% by week 12 Date Initiated: 08/05/2022 Target Resolution Date: 11/04/2022 Goal Status: Active Ulcer/skin breakdown will heal within 14 weeks Date Initiated: 08/05/2022 Target Resolution Date: 11/18/2022 Goal Status: Active Interventions: Assess patient/caregiver ability to obtain necessary supplies Assess patient/caregiver ability to perform ulcer/skin care regimen upon admission and as needed Assess ulceration(s)  every visit Provide education on smoking Provide education on ulcer and skin care Treatment Activities: Skin care regimen initiated : 08/05/2022 Smoking cessation education : 08/05/2022 Notes: Electronic Signature(s) Signed: 08/05/2022 3:04:30 PM By: Midge Aver MSN RN CNS WTA Entered By: Midge Aver on 08/05/2022 14:32:23 -------------------------------------------------------------------------------- Pain Assessment Details Patient Name: Date of Service: Albert Valenzuela ND 08/05/2022 2:00 PM Medical Record Number: [id number] Patient Account Number: [id number] Date of Birth/Sex: Treating RN: May 23, 1990 (31 y.o. Roel Cluck Primary Care Anysa Tacey: PA Zenovia Jordan, NO Other Clinician: Referring Delano Frate: Treating Fiana Gladu/Extender: Christella Noa in Treatment: 0 [place], Olen Cordial ([id number]) [id number] Page 6 of 8 Active Problems Location of Pain Severity and Description of Pain Patient Has Paino Yes Site Locations Rate the pain. Current Pain Level: 4 Worst Pain Level: 7 Least Pain Level: 2 Tolerable Pain Level: 2 Character of Pain Describe the Pain: Difficult to Pinpoint, Dull Pain Management and Medication Current Pain Management: Medication: Yes Electronic Signature(s) Signed: 08/05/2022 3:04:30 PM By: Midge Aver MSN RN CNS WTA Entered By: Midge Aver on 08/05/2022 13:47:34 -------------------------------------------------------------------------------- Patient/Caregiver Education Details Patient Name: Date of Service:  Albert Valenzuela, Albert Valenzuela ND 4/15/2024andnbsp2:00 PM Medical Record Number: [id number] Patient Account Number: [id number] Date of Birth/Gender: Treating RN: 04-Aug-1990 (31 y.o. Roel Cluck Primary Care Physician: PA Zenovia Jordan, NO Other Clinician: Referring Physician: Treating Physician/Extender: Christella Noa in Treatment: 0 Education Assessment Education Provided To: Patient Education Topics Provided Welcome T  [facility] Packet: o Handouts: Welcome T [facility] o Methods: Explain/Verbal Responses: State content correctly Wound/Skin Impairment: Handouts: Caring for Your Ulcer Methods: Explain/Verbal Responses: State content correctly Electronic Signature(s) Signed: 08/05/2022 3:04:30 PM By: Midge Aver MSN RN CNS WTA Entered By: Midge Aver on 08/05/2022 14:32:44 Melodie Bouillon ([id number]) [id number] Page 7 of 8 -------------------------------------------------------------------------------- Wound Assessment Details Patient Name: Date of Service: Albert Valenzuela, Albert Valenzuela ND 08/05/2022 2:00 PM Medical Record Number: [id number] Patient Account Number: [id number] Date of Birth/Sex: Treating RN: 12-15-1990 (31 y.o. Roel Cluck Primary Care Daquana Paddock: PA Zenovia Jordan, NO Other Clinician: Referring Chantae Soo: Treating Sarai January/Extender: Christella Noa in Treatment: 0 Wound Status Wound Number: 1 Primary Etiology: Trauma, Other Wound Location: Right Neck Wound Status: Open Wounding Event: Trauma Date Acquired: 07/28/2022 Weeks Of Treatment: 0 Clustered Wound: No Photos Wound Measurements Length: (cm) 2 Width: (cm) 1.7 Depth: (cm) 0.1 Area: (cm) 2.67 Volume: (cm) 0.267 % Reduction in Area: % Reduction in Volume: Epithelialization: None Wound Description Classification: Full Thickness Without Exposed Support Structures Exudate Amount: Medium Exudate Type: Serosanguineous Exudate Color: red, brown Foul Odor After Cleansing: No Slough/Fibrino Yes Wound Bed Granulation Amount: Small (1-33%) Exposed Structure Granulation Quality: Pink Fascia Exposed: No Necrotic Amount: Medium (34-66%) Fat Layer (Subcutaneous Tissue) Exposed: Yes Necrotic Quality: Adherent Slough Tendon Exposed: No Muscle Exposed: No Joint Exposed: No Bone Exposed: No Treatment Notes Wound #1 (Neck) Wound Laterality: Right Cleanser Soap and  Water Discharge Instruction: Gently cleanse wound with antibacterial soap, rinse and pat dry prior to dressing wounds Peri-Wound Care Topical Primary Dressing IODOFLEX 0.9% Cadexomer Iodine Pad Discharge Instruction: Apply Iodoflex to wound bed only as directed. (BORDER) Zetuvit Plus Silicone Border Dressing 4x4 (in/in) Secondary Dressing Secured With Compression Albert Valenzuela, Albert Valenzuela ([id number]) [id number] Page 8 of 8 Compression Stockings Add-Ons Electronic Signature(s) Signed: 08/05/2022 3:04:30 PM By: Midge Aver MSN RN CNS WTA Entered By: Midge Aver on 08/05/2022 14:01:16 -------------------------------------------------------------------------------- Vitals Details Patient Name: Date of Service: Albert Valenzuela ND 08/05/2022 2:00 PM Medical Record Number: [id number] Patient Account Number: [id number] Date of Birth/Sex: Treating RN: 24-May-1990 (31 y.o. Roel Cluck Primary Care Anais Koenen: PA Zenovia Jordan, NO Other Clinician: Referring Avishai Reihl: Treating Dalaney Needle/Extender: Christella Noa in Treatment: 0 Vital Signs Time Taken: 13:49 Temperature (F): 99.0 Height (in): 66 Pulse (bpm): 81 Source: Measured Respiratory Rate (breaths/Albert Valenzuela): 16 Weight (lbs): 217 Blood Pressure (mmHg): 133/87 Source: Stated Reference Range: 80 - 120 mg / dl Body Mass Index (BMI): 35 Electronic Signature(s) Signed: 08/05/2022 3:04:30 PM By: Midge Aver MSN RN CNS WTA Entered By: Midge Aver on 08/05/2022 13:49:42
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
## 2022-08-12 ENCOUNTER — Encounter: Payer: Medicaid Other | Admitting: Physician Assistant

## 2022-08-12 DIAGNOSIS — S1193XA Puncture wound without foreign body of unspecified part of neck, initial encounter: Secondary | ICD-10-CM | POA: Diagnosis not present
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
## 2022-08-13 NOTE — Progress Notes (Signed)
BENSON, PORCARO ([id number]) [id number] Page 1 of 8 Visit Report for 08/12/2022 Arrival Information Details Patient Name: Date of Service: Albert Valenzuela, Albert Valenzuela 08/12/2022 2:30 PM Medical Record Number: [id number] Patient Account Number: [id number] Date of Birth/Sex: Treating RN: [birth year] (32 y.o. Albert Valenzuela) Yevonne Pax Primary Care Kortlynn Poust: PA Zenovia Jordan, NO Other Clinician: Referring Fina Heizer: Treating Loreda Silverio/Extender: Christella Noa in Treatment: 1 Visit Information History Since Last Visit Added or deleted any medications: No Patient Arrived: Ambulatory Any new allergies or adverse reactions: No Arrival Time: 13:59 Had a fall or experienced change in No Accompanied By: friend activities of daily living that may affect Transfer Assistance: None risk of falls: Patient Identification Verified: Yes Signs or symptoms of abuse/neglect since last visito No Secondary Verification Process Completed: Yes Hospitalized since last visit: No Patient Requires Transmission-Based Precautions: No Implantable device outside of the clinic excluding No Patient Has Alerts: Yes cellular tissue based products placed in the center Patient Alerts: NOT diabetic since last visit: Has Dressing in Place as Prescribed: Yes Pain Present Now: No Electronic Signature(s) Signed: 08/12/2022 4:39:06 PM By: Yevonne Pax RN Entered By: Yevonne Pax on 08/12/2022 14:02:34 -------------------------------------------------------------------------------- Clinic Level of Care Assessment Details Patient Name: Date of Service: Albert Valenzuela 08/12/2022 2:30 PM Medical Record Number: [id number] Patient Account Number: [id number] Date of Birth/Sex: Treating RN: 1991-04-03 (32 y.o. Albert Valenzuela) Yevonne Pax Primary Care Jeanmarc Viernes: PA Zenovia Jordan, NO Other Clinician: Referring Zaela Graley: Treating Abrey Bradway/Extender: Christella Noa in Treatment: 1 Clinic Level of Care Assessment Items TOOL 4  Quantity Score X- 1 0 Use when only an EandM is performed on FOLLOW-UP visit ASSESSMENTS - Nursing Assessment / Reassessment X- 1 10 Reassessment of Co-morbidities (includes updates in patient status) X- 1 5 Reassessment of Adherence to Treatment Plan ASSESSMENTS - Wound and Skin A ssessment / Reassessment X - Simple Wound Assessment / Reassessment - one wound 1 5 []  - 0 Complex Wound Assessment / Reassessment - multiple wounds []  - 0 Dermatologic / Skin Assessment (not related to wound area) ASSESSMENTS - Focused Assessment []  - 0 Circumferential Edema Measurements - multi extremities []  - 0 Nutritional Assessment / Counseling / Intervention []  - 0 Lower Extremity Assessment (monofilament, tuning fork, pulses) []  - 0 Peripheral Arterial Disease Assessment (using hand held doppler) ASSESSMENTS - Ostomy and/or Continence Assessment and Care []  - 0 Incontinence Assessment and Management []  - 0 Ostomy Care Assessment and Management (repouching, etc.) PROCESS - Coordination of Care X - Simple Patient / Family Education for ongoing care 1 15 [place], Olen Cordial ([id number]) [id number] Page 2 of 8 []  - 0 Complex (extensive) Patient / Family Education for ongoing care []  - 0 Staff obtains Chiropractor, Records, T Results / Process Orders est []  - 0 Staff telephones HHA, Nursing Homes / Clarify orders / etc []  - 0 Routine Transfer to another Facility (non-emergent condition) []  - 0 Routine Hospital Admission (non-emergent condition) []  - 0 New Admissions / Manufacturing engineer / Ordering NPWT Apligraf, etc. , []  - 0 Emergency Hospital Admission (emergent condition) X- 1 10 Simple Discharge Coordination []  - 0 Complex (extensive) Discharge Coordination PROCESS - Special Needs []  - 0 Pediatric / Minor Patient Management []  - 0 Isolation Patient Management []  - 0 Hearing / Language / Visual special needs []  - 0 Assessment of Community assistance  (transportation, D/C planning, etc.) []  - 0 Additional assistance / Altered mentation []  - 0 Support Surface(s) Assessment (bed, cushion, seat, etc.) INTERVENTIONS - Wound Cleansing / Measurement X - Simple Wound  Cleansing - one wound 1 5  - 0 Complex Wound Cleansing - multiple wounds X- 1 5 Wound Imaging (photographs - any number of wounds)  - 0 Wound Tracing (instead of photographs) X- 1 5 Simple Wound Measurement - one wound  - 0 Complex Wound Measurement - multiple wounds INTERVENTIONS - Wound Dressings X - Small Wound Dressing one or multiple wounds 1 10  - 0 Medium Wound Dressing one or multiple wounds  - 0 Large Wound Dressing one or multiple wounds  - 0 Application of Medications - topical  - 0 Application of Medications - injection INTERVENTIONS - Miscellaneous  - 0 External ear exam  - 0 Specimen Collection (cultures, biopsies, blood, body fluids, etc.)  - 0 Specimen(s) / Culture(s) sent or taken to Lab for analysis  - 0 Patient Transfer (multiple staff / Nurse, adult / Similar devices)  - 0 Simple Staple / Suture removal (25 or less)  - 0 Complex Staple / Suture removal (26 or more)  - 0 Hypo / Hyperglycemic Management (close monitor of Blood Glucose)  - 0 Ankle / Brachial Index (ABI) - do not check if billed separately X- 1 5 Vital Signs Has the patient been seen at the hospital within the last three years: Yes Total Score: 75 Level Of Care: New/Established - Level 2 Electronic Signature(s) Signed: 08/12/2022 4:39:06 PM By: Yevonne Pax RN Entered By: Yevonne Pax on 08/12/2022 14:12:59 Melodie Bouillon ([id number]) [id number] Page 3 of 8 -------------------------------------------------------------------------------- Encounter Discharge Information Details Patient Name: Date of Service: Albert Valenzuela 08/12/2022 2:30 PM Medical Record Number: [id number] Patient Account Number: [id number] Date of  Birth/Sex: Treating RN: 1991/04/13 (32 y.o. Albert Valenzuela) Yevonne Pax Primary Care Ishika Chesterfield: PA Zenovia Jordan, NO Other Clinician: Referring Jkwon Treptow: Treating Quenna Doepke/Extender: Christella Noa in Treatment: 1 Encounter Discharge Information Items Discharge Condition: Stable Ambulatory Status: Ambulatory Discharge Destination: Home Transportation: Private Auto Accompanied By: friend Schedule Follow-up Appointment: Yes Clinical Summary of Care: Electronic Signature(s) Signed: 08/12/2022 4:39:06 PM By: Yevonne Pax RN Entered By: Yevonne Pax on 08/12/2022 14:13:55 -------------------------------------------------------------------------------- Lower Extremity Assessment Details Patient Name: Date of Service: KRYSTOFER, HEVENER Valenzuela 08/12/2022 2:30 PM Medical Record Number: [id number] Patient Account Number: [id number] Date of Birth/Sex: Treating RN: 1990-08-27 (31 y.o. Melonie [place] Primary Care Special Ranes: PA Zenovia Jordan, NO Other Clinician: Referring Isais Klipfel: Treating Rawson Minix/Extender: Christella Noa in Treatment: 1 Electronic Signature(s) Signed: 08/12/2022 4:39:06 PM By: Yevonne Pax RN Entered By: Yevonne Pax on 08/12/2022 14:07:29 -------------------------------------------------------------------------------- Multi Wound Chart Details Patient Name: Date of Service: Wyonia Hough Valenzuela 08/12/2022 2:30 PM Medical Record Number: [id number] Patient Account Number: [id number] Date of Birth/Sex: Treating RN: [birth year] (31 y.o. Melonie [place] Primary Care Zadrian Mccauley: PA Zenovia Jordan, NO Other Clinician: Referring Trinadee Verhagen: Treating Jackqueline Aquilar/Extender: Christella Noa in Treatment: 1 Vital Signs Height(in): 66 Pulse(bpm): 73 Weight(lbs): 217 Blood Pressure(mmHg): 130/88 Body Mass Index(BMI): 35 Temperature(F): 98.2 Respiratory Rate(breaths/min): 18 [1:Photos:] [N/A:N/A] RALLY, OUCH ([id number]) [1:Right Neck Wound Location: Trauma Wounding Event: Trauma, Other  Primary Etiology: 07/28/2022 Date Acquired: 1 Weeks of Treatment: Open Wound Status: No Wound Recurrence: 0.7x2.5x0.1 Measurements L x W x D (cm) 1.374 A (cm) : rea 0.137 Volume (cm) :  48.50% % Reduction in A rea: 48.70% % Reduction in Volume: Full Thickness Without Exposed Classification: Support Structures Medium Exudate Amount: Serosanguineous Exudate Type: red, brown Exudate Color: Small (1-33%) Granulation Amount: Pink Granulation  Quality: Medium (34-66%) Necrotic Amount: Fat Layer (Subcutaneous Tissue): Yes N/A Exposed Structures: Fascia: No Tendon: No Muscle:  No Joint: No Bone: No None Epithelialization:] [N/A:N/A N/A N/A N/A N/A N/A N/A N/A N/A N/A N/A N/A N/A N/A N/A N/A N/A  N/A N/A N/A] Treatment Notes Electronic Signature(s) Signed: 08/12/2022 4:39:06 PM By: Yevonne Pax RN Entered By: Yevonne Pax on 08/12/2022 14:07:33 -------------------------------------------------------------------------------- Multi-Disciplinary Care Plan Details Patient Name: Date of Service: Wyonia Hough Valenzuela 08/12/2022 2:30 PM Medical Record Number: [id number] Patient Account Number: [id number] Date of Birth/Sex: Treating RN: Feb 08, 1991 (32 y.o. Albert Valenzuela) Yevonne Pax Primary Care Curry Dulski: PA Zenovia Jordan, NO Other Clinician: Referring Iridessa Harrow: Treating Smiley Birr/Extender: Christella Noa in Treatment: 1 Active Inactive Necrotic Tissue Nursing Diagnoses: Impaired tissue integrity related to necrotic/devitalized tissue Knowledge deficit related to management of necrotic/devitalized tissue Goals: Necrotic/devitalized tissue will be minimized in the wound bed Date Initiated: 08/05/2022 Target Resolution Date: 09/04/2022 Goal Status: Active Patient/caregiver will verbalize understanding of reason and process for debridement of necrotic tissue Date Initiated: 08/05/2022 Target Resolution Date: 09/04/2022 Goal Status: Active Interventions: Assess patient pain level pre-, during and post procedure and  prior to discharge Provide education on necrotic tissue and debridement process Treatment Activities: Apply topical anesthetic as ordered : 08/05/2022 Notes: Orientation to the Wound Care Program Nursing Diagnoses: Knowledge deficit related to the wound healing center program [place] ([id number]) [phone].pdf Page 5 of 8 Goals: Patient/caregiver will verbalize understanding of the Wound Healing Center Program Date Initiated: 08/05/2022 Target Resolution Date: 09/04/2022 Goal Status: Active Interventions: Provide education on orientation to the wound center Notes: Wound/Skin Impairment Nursing Diagnoses: Impaired tissue integrity Knowledge deficit related to smoking impact on wound healing Knowledge deficit related to ulceration/compromised skin integrity Goals: Patient will demonstrate a reduced rate of smoking or cessation of smoking Date Initiated: 08/05/2022 Target Resolution Date: 09/04/2022 Goal Status: Active Patient/caregiver will verbalize understanding of skin care regimen Date Initiated: 08/05/2022 Target Resolution Date: 09/04/2022 Goal Status: Active Ulcer/skin breakdown will have a volume reduction of 30% by week 4 Date Initiated: 08/05/2022 Target Resolution Date: 09/04/2022 Goal Status: Active Ulcer/skin breakdown will have a volume reduction of 50% by week 8 Date Initiated: 08/05/2022 Target Resolution Date: 10/05/2022 Goal Status: Active Ulcer/skin breakdown will have a volume reduction of 80% by week 12 Date Initiated: 08/05/2022 Target Resolution Date: 11/04/2022 Goal Status: Active Ulcer/skin breakdown will heal within 14 weeks Date Initiated: 08/05/2022 Target Resolution Date: 11/18/2022 Goal Status: Active Interventions: Assess patient/caregiver ability to obtain necessary supplies Assess patient/caregiver ability to perform ulcer/skin care regimen upon admission and as needed Assess ulceration(s) every visit Provide education  on smoking Provide education on ulcer and skin care Treatment Activities: Skin care regimen initiated : 08/05/2022 Smoking cessation education : 08/05/2022 Notes: Electronic Signature(s) Signed: 08/12/2022 4:39:06 PM By: Yevonne Pax RN Entered By: Yevonne Pax on 08/12/2022 14:07:49 -------------------------------------------------------------------------------- Pain Assessment Details Patient Name: Date of Service: TREJUAN, MATHERNE Valenzuela 08/12/2022 2:30 PM Medical Record Number: [id number] Patient Account Number: [id number] Date of Birth/Sex: Treating RN: 05-14-1990 (31 y.o. Melonie [place] Primary Care Hatsumi Steinhart: PA Zenovia Jordan, NO Other Clinician: Referring Jacqualyn Sedgwick: Treating Ciin Brazzel/Extender: Christella Noa in Treatment: 1 Active Problems Location of Pain Severity and Description of Pain Patient Has Paino No Site Locations [place], Kramer ([id number]) [id number] Page 6 of 8 Pain Management and Medication Current Pain Management: Electronic Signature(s) Signed: 08/12/2022 4:39:06 PM By: Yevonne Pax RN Entered By: Yevonne Pax on 08/12/2022 14:03:07 -------------------------------------------------------------------------------- Patient/Caregiver Education Details Patient Name: Date of Service: Wyonia Hough Valenzuela 4/22/2024andnbsp2:30 PM Medical Record Number: [id number] Patient Account Number: [id number] Date of Birth/Gender: Treating RN: 03/05/1991 (  32 y.o. Albert Valenzuela) Yevonne Pax Primary Care Physician: PA Zenovia Jordan, [place] Other Clinician: Referring Physician: Treating Physician/Extender: Christella Noa in Treatment: 1 Education Assessment Education Provided To: Patient Education Topics Provided Wound Debridement: Handouts: Wound Debridement Methods: Explain/Verbal Responses: State content correctly Electronic Signature(s) Signed: 08/12/2022 4:39:06 PM By: Yevonne Pax RN Entered By: Yevonne Pax on 08/12/2022  14:08:09 -------------------------------------------------------------------------------- Wound Assessment Details Patient Name: Date of Service: LADARIEN, BEEKS Valenzuela 08/12/2022 2:30 PM Medical Record Number: [id number] Patient Account Number: [id number] Date of Birth/Sex: Treating RN: [birth year] (31 y.o. Melonie [place] Primary Care Pietra Zuluaga: PA Zenovia Jordan, NO Other Clinician: Referring Baldo Hufnagle: Treating Jahmai Finelli/Extender: Christella Noa in Treatment: 1 Wound Status Wound Number: 1 Primary Etiology: Trauma, Other Wound Location: Right Neck Wound Status: Open Wounding Event: Trauma Date Acquired: 07/28/2022 [facility] Of Treatment: 1 Melodie Bouillon ([id number]) [id number] Page 7 of 8 Clustered Wound: No Photos Wound Measurements Length: (cm) 0.7 Width: (cm) 2.5 Depth: (cm) 0.1 Area: (cm) 1.374 Volume: (cm) 0.137 % Reduction in Area: 48.5% % Reduction in Volume: 48.7% Epithelialization: None Tunneling: No Undermining: No Wound Description Classification: Full Thickness Without Exposed Support Exudate Amount: Medium Exudate Type: Serosanguineous Exudate Color: red, brown Structures Foul Odor After Cleansing: No Slough/Fibrino Yes Wound Bed Granulation Amount: Small (1-33%) Exposed Structure Granulation Quality: Pink Fascia Exposed: No Necrotic Amount: Medium (34-66%) Fat Layer (Subcutaneous Tissue) Exposed: Yes Necrotic Quality: Adherent Slough Tendon Exposed: No Muscle Exposed: No Joint Exposed: No Bone Exposed: No Treatment Notes Wound #1 (Neck) Wound Laterality: Right Cleanser Soap and Water Discharge Instruction: Gently cleanse wound with antibacterial soap, rinse and pat dry prior to dressing wounds Peri-Wound Care Topical Primary Dressing IODOFLEX 0.9% Cadexomer Iodine Pad Discharge Instruction: Apply Iodoflex to wound bed only as directed. Secondary Dressing Coverlet Latex-Free Fabric Adhesive Dressings Discharge  Instruction: Knuckle Secured With Compression Wrap Compression Stockings Add-Ons Electronic Signature(s) Signed: 08/12/2022 4:39:06 PM By: Yevonne Pax RN Entered By: Yevonne Pax on 08/12/2022 14:07:14 Vitals Details -------------------------------------------------------------------------------- Melodie Bouillon ([id number]) [id number] Page 8 of 8 Patient Name: Date of Service: ORHAN, MAYORGA Valenzuela 08/12/2022 2:30 PM Medical Record Number: [id number] Patient Account Number: [id number] Date of Birth/Sex: Treating RN: 08/09/1990 (32 y.o. Albert Valenzuela) Yevonne Pax Primary Care Yaira Bernardi: PA Zenovia Jordan, NO Other Clinician: Referring Cian Costanzo: Treating Starling Jessie/Extender: Christella Noa in Treatment: 1 Vital Signs Time Taken: 14:00 Temperature (F): 98.2 Height (in): 66 Pulse (bpm): 73 Weight (lbs): 217 Respiratory Rate (breaths/min): 18 Body Mass Index (BMI): 35 Blood Pressure (mmHg): 130/88 Reference Range: 80 - 120 mg / dl Electronic Signature(s) Signed: 08/12/2022 4:39:06 PM By: Yevonne Pax RN Entered By: Yevonne Pax on 08/12/2022 14:03:00

## 2022-08-16 ENCOUNTER — Ambulatory Visit: Payer: Medicaid Other | Admitting: Physician Assistant

## 2022-08-20 ENCOUNTER — Ambulatory Visit: Payer: Medicaid Other | Admitting: Physician Assistant

## 2022-08-28 ENCOUNTER — Ambulatory Visit (HOSPITAL_BASED_OUTPATIENT_CLINIC_OR_DEPARTMENT_OTHER): Payer: Medicaid Other | Admitting: Physician Assistant

## 2022-08-28 NOTE — Progress Notes (Signed)
JOSHUAH, FILKINS ([id number]) [id number] Page 1 of 1 Visit Report for 08/28/2022 Allergy List Details Patient Name: Date of Service: [name], [name] ND 08/28/2022 8:00 A M Medical Record Number: [id number] Patient Account Number: [id number] Date of Birth/Sex: Treating RN: 1990-07-10 (31 y.o. [name] Primary Care [name]: Other Clinician: Referring [name]: Treating [name] in Treatment: 0 Allergies Active Allergies No Known Drug Allergies Allergy Notes Electronic Signature(s) Signed: 08/28/2022 11:31:33 AM By: Shawn Stall RN, BSN Entered By: Shawn Stall on 08/27/2022 16:12:44

## 2022-08-29 ENCOUNTER — Ambulatory Visit: Payer: Medicaid Other | Admitting: Physician Assistant

## 2022-10-21 ENCOUNTER — Ambulatory Visit: Payer: Medicaid Other | Admitting: Family Medicine
